# Patient Record
Sex: Male | Born: 1980 | Hispanic: No | Marital: Married | Smoking: Current every day smoker
Health system: Southern US, Community
[De-identification: ages and names within clinical notes are randomized; demographics above are authoritative.]

## PROBLEM LIST (undated history)

## (undated) DIAGNOSIS — G4730 Sleep apnea, unspecified: Secondary | ICD-10-CM

---

## 2016-12-22 ENCOUNTER — Emergency Department

## 2016-12-22 ENCOUNTER — Encounter: Payer: Self-pay | Admitting: Emergency Medicine

## 2016-12-22 ENCOUNTER — Emergency Department
Admission: EM | Admit: 2016-12-22 | Discharge: 2016-12-22 | Disposition: A | Discharge status: Home / Self Care | Attending: Student in an Organized Health Care Education/Training Program | Admitting: Student in an Organized Health Care Education/Training Program

## 2016-12-22 DIAGNOSIS — J40 Bronchitis, not specified as acute or chronic: Secondary | ICD-10-CM | POA: Diagnosis not present

## 2016-12-22 DIAGNOSIS — R0602 Shortness of breath: Secondary | ICD-10-CM | POA: Diagnosis present

## 2016-12-22 LAB — CBC
HCT: 43.6 % (ref 40.0–52.0)
Hemoglobin: 15.5 g/dL (ref 13.0–18.0)
MCH: 31.2 pg (ref 26.0–34.0)
MCHC: 35.6 g/dL (ref 32.0–36.0)
MCV: 87.5 fL (ref 80.0–100.0)
PLATELETS: 204 10*3/uL (ref 150–440)
RBC: 4.98 MIL/uL (ref 4.40–5.90)
RDW: 13.7 % (ref 11.5–14.5)
WBC: 12.8 10*3/uL — AB (ref 3.8–10.6)

## 2016-12-22 LAB — BASIC METABOLIC PANEL
Anion gap: 10 (ref 5–15)
BUN: 12 mg/dL (ref 6–20)
CO2: 25 mmol/L (ref 22–32)
CREATININE: 0.96 mg/dL (ref 0.61–1.24)
Calcium: 9.3 mg/dL (ref 8.9–10.3)
Chloride: 99 mmol/L — ABNORMAL LOW (ref 101–111)
Glucose, Bld: 143 mg/dL — ABNORMAL HIGH (ref 65–99)
Potassium: 3.8 mmol/L (ref 3.5–5.1)
SODIUM: 134 mmol/L — AB (ref 135–145)

## 2016-12-22 LAB — TROPONIN I

## 2016-12-22 MED ORDER — IPRATROPIUM-ALBUTEROL 0.5-2.5 (3) MG/3ML IN SOLN
3.0000 mL | Freq: Once | RESPIRATORY_TRACT | Status: AC
  Administered 2016-12-22: 3 mL via RESPIRATORY_TRACT

## 2016-12-22 MED ORDER — ALBUTEROL SULFATE HFA 108 (90 BASE) MCG/ACT IN AERS: 2.0000 | INHALATION_SPRAY | Freq: Four times a day (QID) | RESPIRATORY_TRACT | 2 refills | Status: AC | PRN

## 2016-12-22 MED ORDER — DOXYCYCLINE HYCLATE 50 MG PO CAPS: 100.0000 mg | ORAL_CAPSULE | Freq: Two times a day (BID) | ORAL | 0 refills | Status: AC

## 2016-12-22 MED ORDER — DOXYCYCLINE HYCLATE 100 MG PO TABS
100.0000 mg | ORAL_TABLET | Freq: Once | ORAL | Status: AC
  Administered 2016-12-22: 100 mg via ORAL
  Filled 2016-12-22: qty 1

## 2016-12-22 MED ORDER — IPRATROPIUM-ALBUTEROL 0.5-2.5 (3) MG/3ML IN SOLN
RESPIRATORY_TRACT | Status: AC
  Administered 2016-12-22: 3 mL via RESPIRATORY_TRACT
  Filled 2016-12-22: qty 3

## 2016-12-22 MED ORDER — SODIUM CHLORIDE 0.9 % IV BOLUS (SEPSIS)
1000.0000 mL | Freq: Once | INTRAVENOUS | Status: AC
  Administered 2016-12-22: 1000 mL via INTRAVENOUS

## 2016-12-22 MED ORDER — PREDNISONE 20 MG PO TABS: 40.0000 mg | ORAL_TABLET | Freq: Every day | ORAL | 0 refills | Status: AC

## 2016-12-22 MED ORDER — PREDNISONE 20 MG PO TABS
60.0000 mg | ORAL_TABLET | Freq: Once | ORAL | Status: AC
  Administered 2016-12-22: 60 mg via ORAL
  Filled 2016-12-22: qty 3

## 2016-12-22 NOTE — ED Triage Notes (Signed)
Pt ambulatory to triage with steady gait, no distress noted. Pt c/o cough and chest congestion x1 week. Pt to ED today with increased SOB and chest pressure. Pt denies chest pain, N/V. Per pt, he has taken OTC cold and flu medication without relief. Hx of bronchitis.

## 2016-12-22 NOTE — ED Notes (Signed)
Pt states cough, dizziness, cold chills since Friday. States mid CP. Coughing up green/brown phlegm. Hx bronchitis. Denies asthma or DVT. Pt wears cpap at home. Alert, oriented, no distress noted. Ambulated to room.

## 2016-12-22 NOTE — ED Provider Notes (Signed)
[facility] Emergency Department Provider Note    First MD Initiated Contact with Patient 12/22/16 2114     (approximate)  I have reviewed the triage vital signs and the nursing notes.   HISTORY  Chief Complaint Shortness of Breath    HPI Francisco Davis is a 36 y.o. male previously healthy male presents with chief complaint of productive cough congestion chills and shortness of breath. Symptoms have been ongoing for 1 week. Was taken over-the-counter Tamiflu without any improvement. Does not smoke.  States that he's been having productive cough looking like green and brown phlegm. States he has been diagnosed with bronchitis before. Does have a history of sleep apnea and wears a CPAP at night. No lower extremity swelling. No prolonged travel. No pleuritic pain.   History reviewed. No pertinent past medical history. History reviewed. No pertinent family history. History reviewed. No pertinent surgical history. There are no active problems to display for this patient.     Prior to Admission medications   Medication Sig Start Date End Date Taking? Authorizing Provider  albuterol (PROVENTIL HFA;VENTOLIN HFA) 108 (90 Base) MCG/ACT inhaler Inhale 2 puffs into the lungs every 6 (six) hours as needed for wheezing or shortness of breath. 12/22/16   Willy Eddy, MD  doxycycline (VIBRAMYCIN) 50 MG capsule Take 2 capsules (100 mg total) by mouth 2 (two) times daily. 12/22/16 12/29/16  Willy Eddy, MD  predniSONE (DELTASONE) 20 MG tablet Take 2 tablets (40 mg total) by mouth daily. 12/22/16 12/27/16  Willy Eddy, MD    Allergies Patient has no known allergies.    Social History Social History  Substance Use Topics  . Smoking status: Never Smoker  . Smokeless tobacco: Never Used  . Alcohol use No    Review of Systems Patient denies headaches, rhinorrhea, blurry vision, numbness, shortness of breath, chest pain, edema, cough, abdominal pain,  nausea, vomiting, diarrhea, dysuria, fevers, rashes or hallucinations unless otherwise stated above in HPI. ____________________________________________   PHYSICAL EXAM:  VITAL SIGNS: Vitals:   12/22/16 2025  BP: 133/81  Pulse: (!) 110  Resp: 18  Temp: 98.6 F (37 C)  SpO2: 96%    Constitutional: Alert and oriented. Well appearing and in no acute distress. Eyes: Conjunctivae are normal.  Head: Atraumatic. Nose: No congestion/rhinnorhea. Mouth/Throat: Mucous membranes are moist.   Neck: No stridor. Painless ROM.  Cardiovascular: Normal rate, regular rhythm. Grossly normal heart sounds.  Good peripheral circulation. Respiratory: Normal respiratory effort.  No retractions. Lungs with coarse breath sounds on right posterior lung fields, faint end expiratory wheeze throughout Gastrointestinal: Soft and nontender. No distention. No abdominal bruits. No CVA tenderness. Musculoskeletal: No lower extremity tenderness nor edema.  No joint effusions. Neurologic:  Normal speech and language. No gross focal neurologic deficits are appreciated. No facial droop Skin:  Skin is warm, dry and intact. No rash noted. Psychiatric: Mood and affect are normal. Speech and behavior are normal.  ____________________________________________   LABS (all labs ordered are listed, but only abnormal results are displayed)  Results for orders placed or performed during the hospital encounter of 12/22/16 (from the past 24 hour(s))  Basic metabolic panel     Status: Abnormal   Collection Time: 12/22/16  8:27 PM  Result Value Ref Range   Sodium 134 (L) 135 - 145 mmol/L   Potassium 3.8 3.5 - 5.1 mmol/L   Chloride 99 (L) 101 - 111 mmol/L   CO2 25 22 - 32 mmol/L   Glucose, Bld 143 (H)  65 - 99 mg/dL   BUN 12 6 - 20 mg/dL   Creatinine, Ser 4.09 0.61 - 1.24 mg/dL   Calcium 9.3 8.9 - 81.1 mg/dL   GFR calc non Af Amer >60 >60 mL/min   GFR calc Af Amer >60 >60 mL/min   Anion gap 10 5 - 15  CBC     Status:  Abnormal   Collection Time: 12/22/16  8:27 PM  Result Value Ref Range   WBC 12.8 (H) 3.8 - 10.6 K/uL   RBC 4.98 4.40 - 5.90 MIL/uL   Hemoglobin 15.5 13.0 - 18.0 g/dL   HCT 91.4 78.2 - 95.6 %   MCV 87.5 80.0 - 100.0 fL   MCH 31.2 26.0 - 34.0 pg   MCHC 35.6 32.0 - 36.0 g/dL   RDW 21.3 08.6 - 57.8 %   Platelets 204 150 - 440 K/uL  Troponin I     Status: None   Collection Time: 12/22/16  8:27 PM  Result Value Ref Range   Troponin I <0.03 <0.03 ng/mL   ____________________________________________  EKG My review and personal interpretation at Time: 20:28   Indication: sob  Rate: 105  Rhythm: sinus Axis: normal Other: normal intervals, no stemi, non specific T wave changes ____________________________________________  RADIOLOGY  I personally reviewed all radiographic images ordered to evaluate for the above acute complaints and reviewed radiology reports and findings.  These findings were personally discussed with the patient.  Please see medical record for radiology report.  ____________________________________________   PROCEDURES  Procedure(s) performed:  Procedures    Critical Care performed: no ____________________________________________   INITIAL IMPRESSION / ASSESSMENT AND PLAN / ED COURSE  Pertinent labs & imaging results that were available during my care of the patient were reviewed by me and considered in my medical decision making (see chart for details).  DDX: pna, bronchitis, ptx, chf, copd, asthma, pe  Francisco Davis is a 36 y.o. who presents to the ED with chief complaint of productive cough chills and shortness of breath. Based on physical exam she does have some component of bronchitic changes in sounds.. Is not in any respiratory distress. Chest x-ray does show bronchitic pattern. Does have mild leukocytosis. He does have mild tachycardia but based on his lung findings and evidence of bronchitis with productive cough and do not feel that this is clinically  consistent with a pulmonary embolism. We'll give IV fluids for clinical dehydration as well as give nebulizer as well as dose of doxycycline here in the ER to treat for community acquired pneumonia. Patient will be given oral steroids. Do feel the patient is stable for a trial of outpatient management.  Have discussed with the patient and available family all diagnostics and treatments performed thus far and all questions were answered to the best of my ability. The patient demonstrates understanding and agreement with plan.       ____________________________________________   FINAL CLINICAL IMPRESSION(S) / ED DIAGNOSES  Final diagnoses:  Bronchitis      NEW MEDICATIONS STARTED DURING THIS VISIT:  New Prescriptions   ALBUTEROL (PROVENTIL HFA;VENTOLIN HFA) 108 (90 BASE) MCG/ACT INHALER    Inhale 2 puffs into the lungs every 6 (six) hours as needed for wheezing or shortness of breath.   DOXYCYCLINE (VIBRAMYCIN) 50 MG CAPSULE    Take 2 capsules (100 mg total) by mouth 2 (two) times daily.   PREDNISONE (DELTASONE) 20 MG TABLET    Take 2 tablets (40 mg total) by mouth daily.  Note:  This document was prepared using Dragon voice recognition software and may include unintentional dictation errors.    Willy Eddy, MD 12/22/16 2130

## 2017-08-27 ENCOUNTER — Encounter: Payer: Self-pay | Admitting: Emergency Medicine

## 2017-08-27 ENCOUNTER — Emergency Department
Admission: EM | Admit: 2017-08-27 | Discharge: 2017-08-28 | Disposition: A | Discharge status: Home / Self Care | Attending: Emergency Medicine | Admitting: Emergency Medicine

## 2017-08-27 ENCOUNTER — Emergency Department

## 2017-08-27 ENCOUNTER — Other Ambulatory Visit: Payer: Self-pay

## 2017-08-27 DIAGNOSIS — F17200 Nicotine dependence, unspecified, uncomplicated: Secondary | ICD-10-CM | POA: Insufficient documentation

## 2017-08-27 DIAGNOSIS — B341 Enterovirus infection, unspecified: Secondary | ICD-10-CM

## 2017-08-27 DIAGNOSIS — R079 Chest pain, unspecified: Secondary | ICD-10-CM

## 2017-08-27 DIAGNOSIS — M7918 Myalgia, other site: Secondary | ICD-10-CM | POA: Diagnosis not present

## 2017-08-27 HISTORY — DX: Sleep apnea, unspecified: G47.30

## 2017-08-27 LAB — BASIC METABOLIC PANEL
Anion gap: 7 (ref 5–15)
BUN: 9 mg/dL (ref 6–20)
CHLORIDE: 105 mmol/L (ref 101–111)
CO2: 25 mmol/L (ref 22–32)
CREATININE: 0.81 mg/dL (ref 0.61–1.24)
Calcium: 8.9 mg/dL (ref 8.9–10.3)
GFR calc Af Amer: >60 mL/min (ref >60)
GFR calc non Af Amer: >60 mL/min (ref >60)
GLUCOSE: 138 mg/dL — AB (ref 65–99)
POTASSIUM: 3.7 mmol/L (ref 3.5–5.1)
Sodium: 137 mmol/L (ref 135–145)

## 2017-08-27 LAB — CBC
HEMATOCRIT: 42.2 % (ref 40.0–52.0)
Hemoglobin: 14.9 g/dL (ref 13.0–18.0)
MCH: 31.2 pg (ref 26.0–34.0)
MCHC: 35.2 g/dL (ref 32.0–36.0)
MCV: 88.7 fL (ref 80.0–100.0)
PLATELETS: 349 10*3/uL (ref 150–440)
RBC: 4.76 MIL/uL (ref 4.40–5.90)
RDW: 13.1 % (ref 11.5–14.5)
WBC: 17.2 10*3/uL — ABNORMAL HIGH (ref 3.8–10.6)

## 2017-08-27 LAB — TROPONIN I: Troponin I: <0.03 ng/mL (ref <0.03)

## 2017-08-27 MED ORDER — KETOROLAC TROMETHAMINE 30 MG/ML IJ SOLN
30.0000 mg | Freq: Once | INTRAMUSCULAR | Status: AC
  Administered 2017-08-28: 30 mg via INTRAVENOUS
  Filled 2017-08-27: qty 1

## 2017-08-27 MED ORDER — TRAMADOL HCL 50 MG PO TABS
50.0000 mg | ORAL_TABLET | Freq: Once | ORAL | Status: AC
  Administered 2017-08-28: 50 mg via ORAL
  Filled 2017-08-27: qty 1

## 2017-08-27 MED ORDER — SODIUM CHLORIDE 0.9 % IV BOLUS
1000.0000 mL | Freq: Once | INTRAVENOUS | Status: AC
  Administered 2017-08-28: 1000 mL via INTRAVENOUS

## 2017-08-27 NOTE — ED Triage Notes (Signed)
Patient with complaint of chest pain that started 2 days ago. Patient reports that initially it was intermittent but now it is constant. Patient states that the pain becomes worse with movement and touch. Patient states that IBU helps the pain. Patient denies shortness of breath or nausea.

## 2017-08-28 LAB — TROPONIN I

## 2017-08-28 MED ORDER — TRAMADOL HCL 50 MG PO TABS
50.0000 mg | ORAL_TABLET | Freq: Four times a day (QID) | ORAL | 0 refills | Status: AC | PRN
Start: 2017-08-28 — End: ?

## 2017-08-28 MED ORDER — ETODOLAC 200 MG PO CAPS: 200.0000 mg | ORAL_CAPSULE | Freq: Three times a day (TID) | ORAL | 0 refills | Status: AC

## 2017-08-28 NOTE — ED Notes (Signed)
Reviewed discharge instructions, follow-up care, and prescriptions with patient. Patient verbalized understanding of all information reviewed. Patient stable, with no distress noted at this time.    

## 2017-08-28 NOTE — Discharge Instructions (Addendum)
Please follow up with your primary care physician for further evaluation of your symptoms.  °

## 2017-08-28 NOTE — ED Notes (Signed)
Pt c/o chest, left upper rib and back pain  Pt reports lying on the approx 1 hour prior to arrival

## 2017-08-28 NOTE — ED Provider Notes (Signed)
[facility] Emergency Department Provider Note   ____________________________________________   First MD Initiated Contact with Patient 08/27/17 2339     (approximate)  I have reviewed the triage vital signs and the nursing notes.   HISTORY  Chief Complaint Chest Pain    HPI Francisco Davis is a 37 y.o. male who comes into the hospital today with some chest pain.  The patient states that the pain is in his upper chest and his sides going into his back.  He reports that the pain started a couple of days ago.  It has become intolerable.  The patient has recently been diagnosed with strep and is currently taking amoxicillin but he denies any other cough or cold symptoms.  The patient tried taking ibuprofen at home but he reports that the pain would get better than return in about an hour.  He rates his pain a 6 out of 10 in intensity currently.  The pain is worse to touch or with movement.  He denies any fever but has a rash to his hands bilaterally.  He states that it showed up a couple of days ago.  He denies any shortness of breath, sweats, nausea or vomiting.  The patient is here today for evaluation of his pain.  Past Medical History:  Diagnosis Date  . Sleep apnea     There are no active problems to display for this patient.   History reviewed. No pertinent surgical history.  Prior to Admission medications   Medication Sig Start Date End Date Taking? Authorizing Provider  albuterol (PROVENTIL HFA;VENTOLIN HFA) 108 (90 Base) MCG/ACT inhaler Inhale 2 puffs into the lungs every 6 (six) hours as needed for wheezing or shortness of breath. 12/22/16   Willy Eddy, MD  etodolac (LODINE) 200 MG capsule Take 1 capsule (200 mg total) by mouth every 8 (eight) hours. 08/28/17   Rebecka Apley, MD  traMADol (ULTRAM) 50 MG tablet Take 1 tablet (50 mg total) by mouth every 6 (six) hours as needed. 08/28/17   Rebecka Apley, MD    Allergies Patient has no  known allergies.  No family history on file.  Social History Social History   Tobacco Use  . Smoking status: Current Every Day Smoker  . Smokeless tobacco: Never Used  Substance Use Topics  . Alcohol use: Yes    Comment: occ  . Drug use: No    Review of Systems  Constitutional: No fever/chills Eyes: No visual changes. ENT:  sore throat. Cardiovascular:  chest pain. Respiratory: Denies shortness of breath. Gastrointestinal: No abdominal pain.  No nausea, no vomiting.  No diarrhea.  No constipation. Genitourinary: Negative for dysuria. Musculoskeletal: Negative for back pain. Skin:  rash. Neurological: Negative for headaches, focal weakness or numbness.   ____________________________________________   PHYSICAL EXAM:  VITAL SIGNS: ED Triage Vitals  Enc Vitals Group     BP 08/27/17 2213 (!) 144/78     Pulse Rate 08/27/17 2213 98     Resp 08/27/17 2213 20     Temp 08/27/17 2213 98.7 F (37.1 C)     Temp Source 08/27/17 2213 Oral     SpO2 08/27/17 2213 100 %     Weight 08/27/17 2214 249 lb (112.9 kg)     Height 08/27/17 2214  (1.854 m)     Head Circumference --      Peak Flow --      Pain Score 08/27/17 2213 6     Pain Loc --  Pain Edu? --      Excl. in GC? --     Constitutional: Alert and oriented. Well appearing and in moderate distress. Eyes: Conjunctivae are normal. PERRL. EOMI. Head: Atraumatic. Nose: No congestion/rhinnorhea. Mouth/Throat: Mucous membranes are moist.  Oropharynx non-erythematous. Cardiovascular: Normal rate, regular rhythm. Grossly normal heart sounds.  Good peripheral circulation. Respiratory: Normal respiratory effort.  No retractions. Lungs CTAB. Gastrointestinal: Soft and nontender. No distention.  Musculoskeletal: No lower extremity tenderness nor edema.  Anterior chest wall tender to palpation Neurologic:  Normal speech and language.  Skin:  Skin is warm, dry and intact.  Pustular lesions noted to patient's bilateral  palms of hands as well as the soles of his feet Psychiatric: Mood and affect are normal.   ____________________________________________   LABS (all labs ordered are listed, but only abnormal results are displayed)  Labs Reviewed  BASIC METABOLIC PANEL - Abnormal; Notable for the following components:      Result Value   Glucose, Bld 138 (*)    All other components within normal limits  CBC - Abnormal; Notable for the following components:   WBC 17.2 (*)    All other components within normal limits  TROPONIN I  TROPONIN I   ____________________________________________  EKG ED ECG REPORT I, Rebecka Apley, the attending physician, personally viewed and interpreted this ECG.   Date: 08/27/2017  EKG Time: 2219  Rate: 86  Rhythm: normal sinus rhythm  Axis: normal  Intervals:none  ST&T Change: none  ____________________________________________  RADIOLOGY  ED MD interpretation:  CXR: No acute cardiopulmonary disease  Official radiology report(s): Dg Chest 2 View  Result Date: 08/27/2017 CLINICAL DATA:  Complaint of chest pain starting 2 days ago. EXAM: CHEST - 2 VIEW COMPARISON:  12/22/2016 FINDINGS: Heart and mediastinal contours are normal and stable in appearance. Chronic coarsened interstitial lung markings and mild central bronchial thickening are redemonstrated without acute pulmonary consolidation, effusion or pneumothorax. Heart and mediastinal contours are normal. IMPRESSION: No acute alveolar consolidation. Chronic bilateral coarsened interstitial lung markings and central peribronchial thickening may reflect stigmata of chronic bronchitic change. Electronically Signed   By: Tollie Eth M.D.   On: 08/27/2017 22:44    ____________________________________________   PROCEDURES  Procedure(s) performed: None  Procedures  Critical Care performed: No  ____________________________________________   INITIAL IMPRESSION / ASSESSMENT AND PLAN / ED COURSE  As  part of my medical decision making, I reviewed the following data within the electronic MEDICAL RECORD NUMBER Notes from prior ED visits and [place] Controlled Substance Database   This is a 37 year old male who comes into the hospital today with some chest pain going into his sides in his back.  The patient also has a rash on his hands and feet.  My differential diagnosis includes costochondritis, acute coronary syndrome, pneumonia  We did send the patient for an x-ray as well as some blood work.  The patient does have an elevated white blood cell count at 17.2 but he is currently undergoing treatment for strep.  The patient does have a rash on his hands which is concerning for coxsackievirus.  I did give the patient a shot of Toradol as well as some tramadol.  The patient had 2 troponins drawn which were negative and a chest x-ray which did not show any pneumonia.  The patient's pain did improve after the medication.  His repeat troponin is negative.  Again I feel that the patient has some musculoskeletal pain likely viral from his coxsackievirus.  His  vital signs are unremarkable and he is in no acute distress.  The patient will be discharged home and encouraged to follow-up with his primary care physician.      ____________________________________________   FINAL CLINICAL IMPRESSION(S) / ED DIAGNOSES  Final diagnoses:  Coxsackie virus infection  Nonspecific chest pain  Musculoskeletal pain     ED Discharge Orders        Ordered    etodolac (LODINE) 200 MG capsule  Every 8 hours     08/28/17 0231    traMADol (ULTRAM) 50 MG tablet  Every 6 hours PRN     08/28/17 0231       Note:  This document was prepared using Dragon voice recognition software and may include unintentional dictation errors.    Rebecka Apley, MD 08/28/17 [phone]

## 2019-04-13 IMAGING — CR DG CHEST 2V
2 series · 2 of 2 positions shown · non-contrast
Comparison: 12/22/2016

CLINICAL DATA: Complaint of chest pain starting 2 days ago.

EXAM:
CHEST - 2 VIEW

[chest pa]
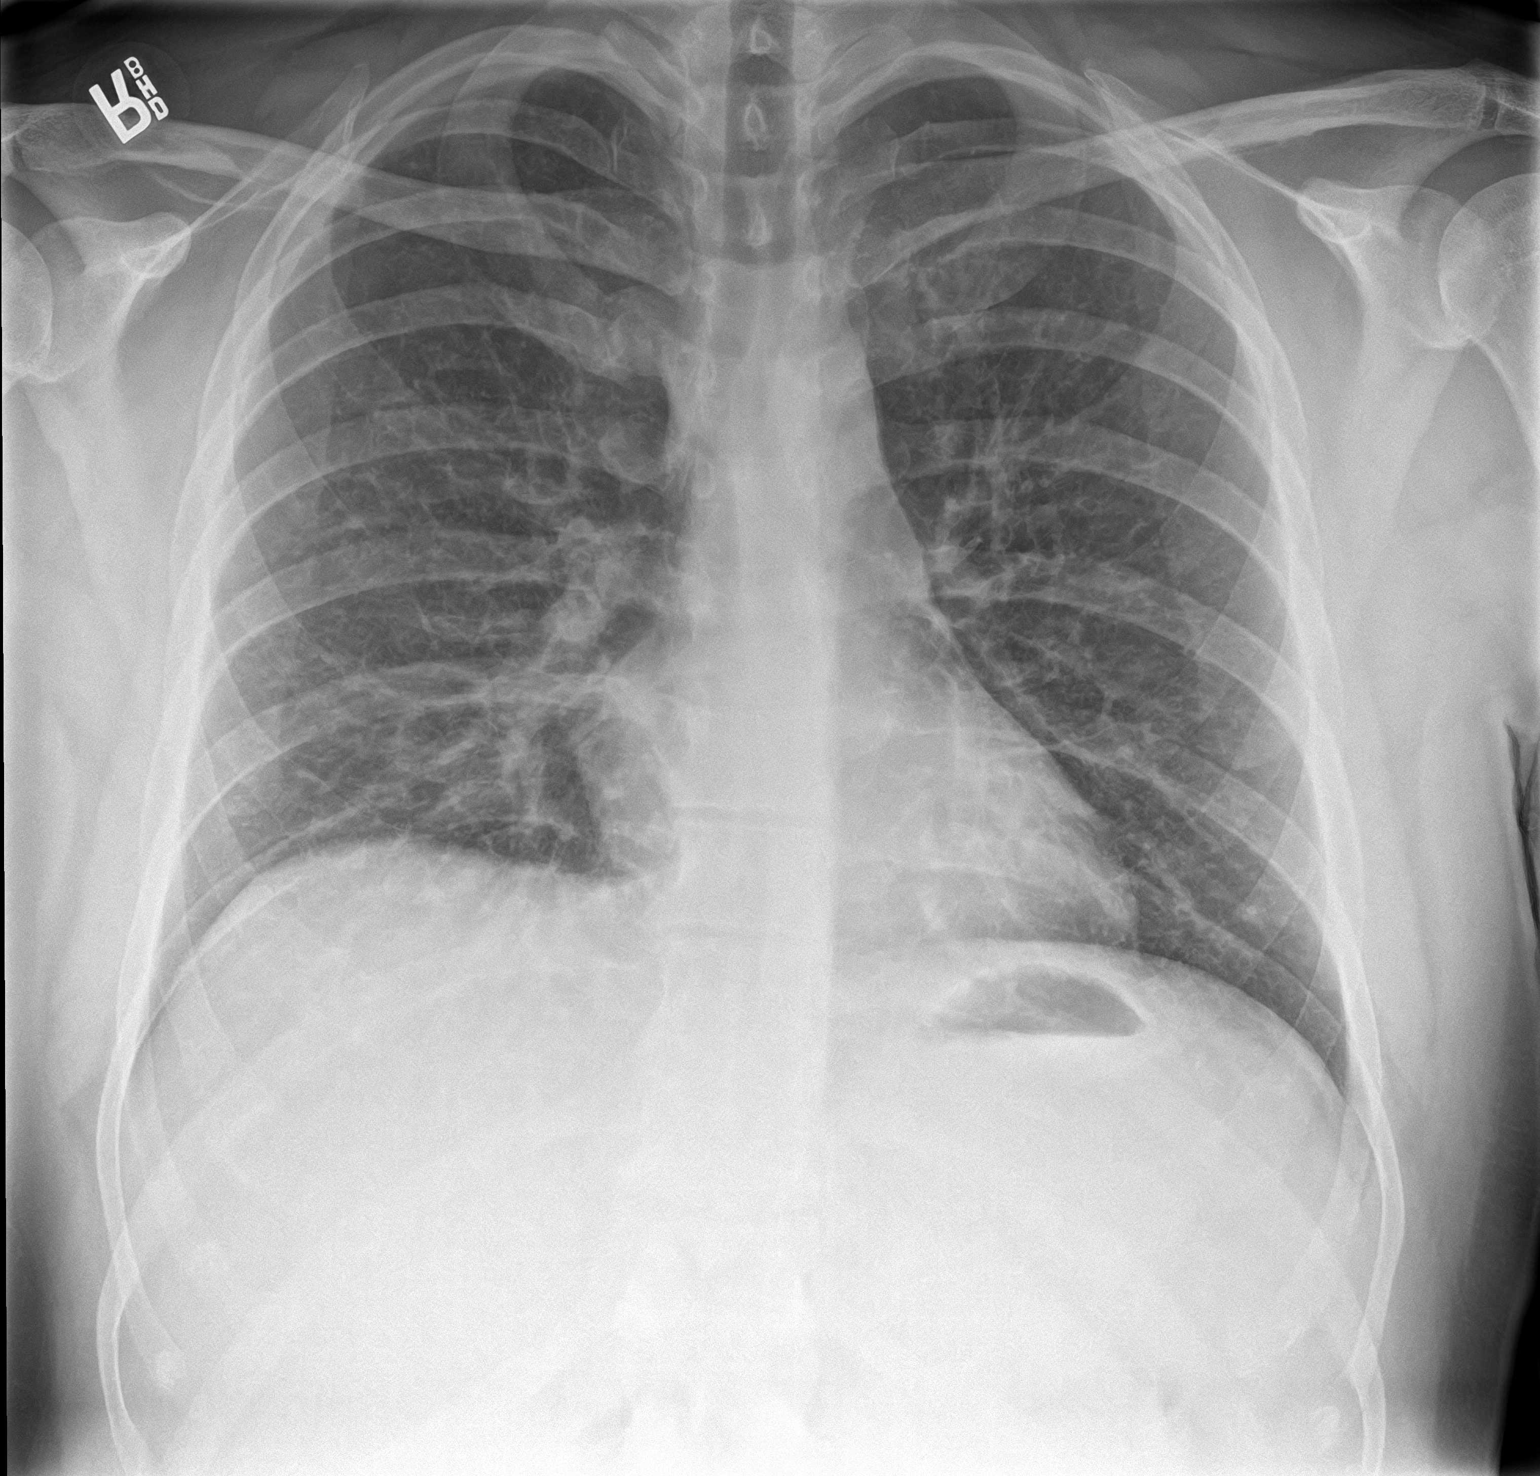

[chest lat]
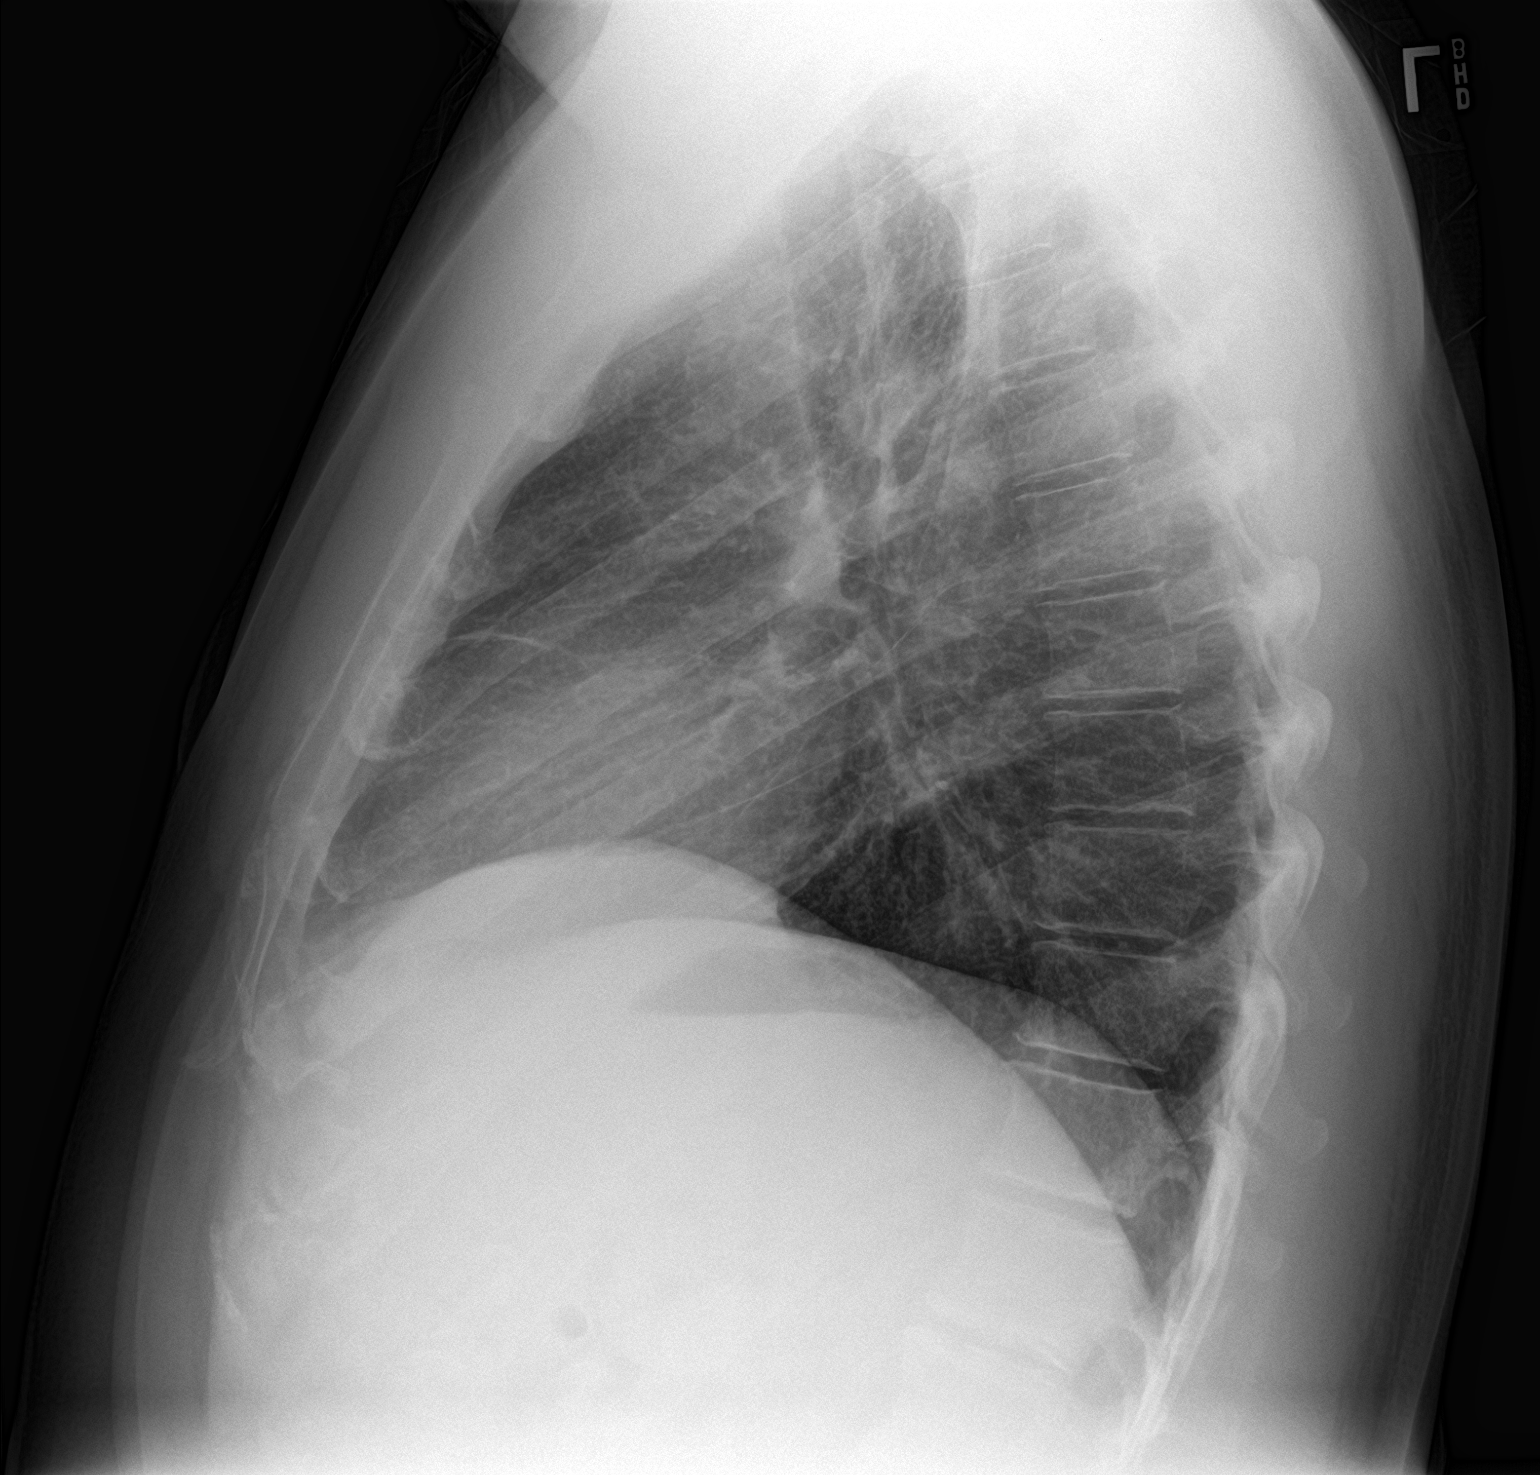

[2 of 2 positions shown; findings below may reference images not displayed]

FINDINGS: Heart and mediastinal contours are normal and stable in appearance.
Chronic coarsened interstitial lung markings and mild central
bronchial thickening are redemonstrated without acute pulmonary
consolidation, effusion or pneumothorax. Heart and mediastinal
contours are normal.
IMPRESSION: No acute alveolar consolidation. Chronic bilateral coarsened
interstitial lung markings and central peribronchial thickening may
reflect stigmata of chronic bronchitic change.
